# Patient Record
Sex: Female | Born: 1975 | Race: White | Hispanic: No | Smoking: Never smoker
Health system: Southern US, Community
[De-identification: ages and names within clinical notes are randomized; demographics above are authoritative.]

## PROBLEM LIST (undated history)

## (undated) DIAGNOSIS — N939 Abnormal uterine and vaginal bleeding, unspecified: Secondary | ICD-10-CM

## (undated) DIAGNOSIS — F419 Anxiety disorder, unspecified: Secondary | ICD-10-CM

## (undated) DIAGNOSIS — R600 Localized edema: Secondary | ICD-10-CM

## (undated) DIAGNOSIS — K219 Gastro-esophageal reflux disease without esophagitis: Secondary | ICD-10-CM

## (undated) DIAGNOSIS — E282 Polycystic ovarian syndrome: Secondary | ICD-10-CM

## (undated) HISTORY — PX: OTHER SURGICAL HISTORY: SHX169

## (undated) HISTORY — DX: Polycystic ovarian syndrome: E28.2

---

## 2014-08-03 HISTORY — PX: COMBINED HYSTEROSCOPY DIAGNOSTIC / D&C: SUR297

## 2015-02-22 LAB — HM PAP SMEAR: HM Pap smear: NEGATIVE

## 2015-04-04 HISTORY — PX: BILATERAL SALPINGECTOMY: SHX5743

## 2015-04-11 ENCOUNTER — Encounter
Admission: RE | Admit: 2015-04-11 | Discharge: 2015-04-11 | Disposition: A | Discharge status: Home / Self Care | Payer: Medicaid Other | Source: Ambulatory Visit | Attending: Obstetrics and Gynecology | Admitting: Obstetrics and Gynecology

## 2015-04-11 DIAGNOSIS — Z01812 Encounter for preprocedural laboratory examination: Secondary | ICD-10-CM | POA: Diagnosis present

## 2015-04-11 HISTORY — DX: Anxiety disorder, unspecified: F41.9

## 2015-04-11 HISTORY — DX: Abnormal uterine and vaginal bleeding, unspecified: N93.9

## 2015-04-11 HISTORY — DX: Gastro-esophageal reflux disease without esophagitis: K21.9

## 2015-04-11 HISTORY — DX: Localized edema: R60.0

## 2015-04-11 LAB — POTASSIUM: Potassium: 3.7 mmol/L (ref 3.5–5.1)

## 2015-04-11 NOTE — Patient Instructions (Signed)
  Your procedure is scheduled on: 9\15\16 Thurs  Report to Day Surgery. To find out your arrival time please call [phone] between 1PM - 3PM on 04/17/15 Wed.  Remember: Instructions that are not followed completely may result in serious medical risk, up to and including death, or upon the discretion of your surgeon and anesthesiologist your surgery may need to be rescheduled.    _x__ 1. Do not eat food or drink liquids after midnight. No gum chewing or hard candies.     ____ 2. No Alcohol for 24 hours before or after surgery.   ____ 3. Bring all medications with you on the day of surgery if instructed.    _x__ 4. Notify your doctor if there is any change in your medical condition     (cold, fever, infections).     Do not wear jewelry, make-up, hairpins, clips or nail polish.  Do not wear lotions, powders, or perfumes. You may wear deodorant.  Do not shave 48 hours prior to surgery. Men may shave face and neck.  Do not bring valuables to the hospital.    [facility] is not responsible for any belongings or valuables.               Contacts, dentures or bridgework may not be worn into surgery.  Leave your suitcase in the car. After surgery it may be brought to your room.  For patients admitted to the hospital, discharge time is determined by your                treatment team.   Patients discharged the day of surgery will not be allowed to drive home.   Please read over the following fact sheets that you were given:      _x___ Take these medicines the morning of surgery with A SIP OF WATER:    1. ALPRAZolam (XANAX) 1 MG tablet  2. Omeprazole (PRILOSEC PO)  3.   4.  5.  6.  ____ Fleet Enema (as directed)   ____ Use CHG Soap as directed  ____ Use inhalers on the day of surgery  ____ Stop metformin 2 days prior to surgery    ____ Take 1/2 of usual insulin dose the night before surgery and none on the morning of surgery.   ____ Stop Coumadin/Plavix/aspirin on   ____  Stop Anti-inflammatories on    ____ Stop supplements until after surgery.    ____ Bring C-Pap to the hospital.

## 2015-04-18 ENCOUNTER — Encounter
Admission: RE | Disposition: A | Discharge status: Home / Self Care | Payer: Self-pay | Source: Ambulatory Visit | Attending: Obstetrics and Gynecology

## 2015-04-18 ENCOUNTER — Ambulatory Visit: Payer: Medicaid Other | Admitting: Anesthesiology

## 2015-04-18 ENCOUNTER — Ambulatory Visit
Admission: RE | Admit: 2015-04-18 | Discharge: 2015-04-18 | Disposition: A | Discharge status: Home / Self Care | Payer: Medicaid Other | Source: Ambulatory Visit | Attending: Obstetrics and Gynecology | Admitting: Obstetrics and Gynecology

## 2015-04-18 ENCOUNTER — Encounter: Payer: Self-pay | Admitting: Anesthesiology

## 2015-04-18 DIAGNOSIS — Z302 Encounter for sterilization: Secondary | ICD-10-CM | POA: Diagnosis not present

## 2015-04-18 DIAGNOSIS — E119 Type 2 diabetes mellitus without complications: Secondary | ICD-10-CM | POA: Insufficient documentation

## 2015-04-18 DIAGNOSIS — N920 Excessive and frequent menstruation with regular cycle: Secondary | ICD-10-CM | POA: Insufficient documentation

## 2015-04-18 DIAGNOSIS — N803 Endometriosis of pelvic peritoneum: Secondary | ICD-10-CM | POA: Insufficient documentation

## 2015-04-18 DIAGNOSIS — F419 Anxiety disorder, unspecified: Secondary | ICD-10-CM | POA: Insufficient documentation

## 2015-04-18 DIAGNOSIS — K219 Gastro-esophageal reflux disease without esophagitis: Secondary | ICD-10-CM | POA: Insufficient documentation

## 2015-04-18 DIAGNOSIS — R600 Localized edema: Secondary | ICD-10-CM | POA: Insufficient documentation

## 2015-04-18 DIAGNOSIS — Z79899 Other long term (current) drug therapy: Secondary | ICD-10-CM | POA: Insufficient documentation

## 2015-04-18 DIAGNOSIS — Z9851 Tubal ligation status: Secondary | ICD-10-CM

## 2015-04-18 HISTORY — PX: ENDOMETRIAL ABLATION: SHX621

## 2015-04-18 HISTORY — PX: LAPAROSCOPIC TUBAL LIGATION: SHX1937

## 2015-04-18 LAB — POCT PREGNANCY, URINE: PREG TEST UR: NEGATIVE

## 2015-04-18 SURGERY — LIGATION, FALLOPIAN TUBE, LAPAROSCOPIC
Anesthesia: General

## 2015-04-18 MED ORDER — OXYCODONE HCL 5 MG/5ML PO SOLN: 5.0000 mg | Freq: Once | ORAL | Status: AC | PRN

## 2015-04-18 MED ORDER — FENTANYL CITRATE (PF) 100 MCG/2ML IJ SOLN
25.0000 ug | INTRAMUSCULAR | Status: AC | PRN
  Administered 2015-04-18 (×6): 25 ug via INTRAVENOUS

## 2015-04-18 MED ORDER — ROCURONIUM BROMIDE 100 MG/10ML IV SOLN
INTRAVENOUS | Status: DC | PRN
  Administered 2015-04-18: 10 mg via INTRAVENOUS
  Administered 2015-04-18: 30 mg via INTRAVENOUS
  Administered 2015-04-18: 10 mg via INTRAVENOUS

## 2015-04-18 MED ORDER — BUPIVACAINE HCL 0.5 % IJ SOLN
INTRAMUSCULAR | Status: DC | PRN
  Administered 2015-04-18: 20 mL

## 2015-04-18 MED ORDER — MIDAZOLAM HCL 2 MG/2ML IJ SOLN
INTRAMUSCULAR | Status: DC | PRN
  Administered 2015-04-18: 2 mg via INTRAVENOUS

## 2015-04-18 MED ORDER — FENTANYL CITRATE (PF) 100 MCG/2ML IJ SOLN
INTRAMUSCULAR | Status: DC | PRN
  Administered 2015-04-18: 100 ug via INTRAVENOUS
  Administered 2015-04-18 (×2): 50 ug via INTRAVENOUS

## 2015-04-18 MED ORDER — GLYCOPYRROLATE 0.2 MG/ML IJ SOLN
INTRAMUSCULAR | Status: DC | PRN
  Administered 2015-04-18: 0.6 mg via INTRAVENOUS

## 2015-04-18 MED ORDER — ONDANSETRON HCL 4 MG/2ML IJ SOLN
INTRAMUSCULAR | Status: DC | PRN
  Administered 2015-04-18: 4 mg via INTRAVENOUS

## 2015-04-18 MED ORDER — FENTANYL CITRATE (PF) 100 MCG/2ML IJ SOLN
INTRAMUSCULAR | Status: AC
  Filled 2015-04-18: qty 2

## 2015-04-18 MED ORDER — OXYCODONE HCL 5 MG PO TABS
ORAL_TABLET | ORAL | Status: AC
  Filled 2015-04-18: qty 1

## 2015-04-18 MED ORDER — NEOSTIGMINE METHYLSULFATE 10 MG/10ML IV SOLN
INTRAVENOUS | Status: DC | PRN
  Administered 2015-04-18: 5 mg via INTRAVENOUS

## 2015-04-18 MED ORDER — LIDOCAINE HCL (CARDIAC) 20 MG/ML IV SOLN
INTRAVENOUS | Status: DC | PRN
  Administered 2015-04-18: 100 mg via INTRAVENOUS

## 2015-04-18 MED ORDER — IBUPROFEN 600 MG PO TABS: 600.0000 mg | ORAL_TABLET | Freq: Four times a day (QID) | ORAL | Status: AC | PRN

## 2015-04-18 MED ORDER — DEXAMETHASONE SODIUM PHOSPHATE 4 MG/ML IJ SOLN
INTRAMUSCULAR | Status: DC | PRN
  Administered 2015-04-18: 10 mg via INTRAVENOUS

## 2015-04-18 MED ORDER — SUCCINYLCHOLINE CHLORIDE 20 MG/ML IJ SOLN
INTRAMUSCULAR | Status: DC | PRN
  Administered 2015-04-18: 100 mg via INTRAVENOUS

## 2015-04-18 MED ORDER — OXYCODONE-ACETAMINOPHEN 5-325 MG PO TABS: 1.0000 | ORAL_TABLET | Freq: Four times a day (QID) | ORAL | Status: DC | PRN

## 2015-04-18 MED ORDER — OXYCODONE HCL 5 MG PO TABS
5.0000 mg | ORAL_TABLET | Freq: Once | ORAL | Status: AC | PRN
  Administered 2015-04-18: 5 mg via ORAL

## 2015-04-18 MED ORDER — PROPOFOL 10 MG/ML IV BOLUS
INTRAVENOUS | Status: DC | PRN
  Administered 2015-04-18: 200 mg via INTRAVENOUS

## 2015-04-18 MED ORDER — LACTATED RINGERS IV SOLN
INTRAVENOUS | Status: DC
  Administered 2015-04-18: 07:00:00 via INTRAVENOUS
  Administered 2015-04-18: 50 mL/h via INTRAVENOUS

## 2015-04-18 SURGICAL SUPPLY — 41 items
APL SRG 38 LTWT LNG FL B (MISCELLANEOUS) ×2
APPLICATOR ARISTA FLEXITIP XL (MISCELLANEOUS) ×4 IMPLANT
APR BAND 32X8 2 INCS BAND (Ring) IMPLANT
BLADE SURG SZ11 CARB STEEL (BLADE) ×4 IMPLANT
CANISTER SUCT 3000ML (MISCELLANEOUS) ×4 IMPLANT
CATH ROBINSON RED A/P 16FR (CATHETERS) ×4 IMPLANT
CHLORAPREP W/TINT 26ML (MISCELLANEOUS) ×4 IMPLANT
DEFOGGER SCOPE WARMER CLEARIFY (MISCELLANEOUS) ×4 IMPLANT
DRSG TEGADERM 2-3/8X2-3/4 SM (GAUZE/BANDAGES/DRESSINGS) ×16 IMPLANT
GAUZE SPONGE NON-WVN 2X2 STRL (MISCELLANEOUS) ×4 IMPLANT
GLOVE BIO SURGEON STRL SZ7 (GLOVE) ×16 IMPLANT
GLOVE INDICATOR 7.5 STRL GRN (GLOVE) ×16 IMPLANT
GOWN STRL REUS W/ TWL LRG LVL3 (GOWN DISPOSABLE) ×4 IMPLANT
GOWN STRL REUS W/TWL LRG LVL3 (GOWN DISPOSABLE) ×8
HEMOSTAT ARISTA ABSORB 1G (Miscellaneous) ×4 IMPLANT
IRRIGATION STRYKERFLOW (MISCELLANEOUS) ×2 IMPLANT
IRRIGATOR STRYKERFLOW (MISCELLANEOUS) ×4
IV LACTATED RINGER IRRG 3000ML (IV SOLUTION) ×4
IV LACTATED RINGERS 1000ML (IV SOLUTION) ×8 IMPLANT
IV LR IRRIG 3000ML ARTHROMATIC (IV SOLUTION) ×2 IMPLANT
KIT DISPOSABLE FALLOPE RING (Ring) IMPLANT
KIT RM TURNOVER CYSTO AR (KITS) ×4 IMPLANT
LABEL OR SOLS (LABEL) ×4 IMPLANT
LIQUID BAND (GAUZE/BANDAGES/DRESSINGS) ×4 IMPLANT
NS IRRIG 500ML POUR BTL (IV SOLUTION) ×4 IMPLANT
PACK DNC HYST (MISCELLANEOUS) ×4 IMPLANT
PACK GYN LAPAROSCOPIC (MISCELLANEOUS) ×4 IMPLANT
PAD OB MATERNITY 4.3X12.25 (PERSONAL CARE ITEMS) ×4 IMPLANT
PAD PREP 24X41 OB/GYN DISP (PERSONAL CARE ITEMS) ×4 IMPLANT
SEAL ROD LENS SCOPE MYOSURE (ABLATOR) ×4 IMPLANT
SET CYSTO W/LG BORE CLAMP LF (SET/KITS/TRAYS/PACK) ×4 IMPLANT
SLEEVE ENDOPATH XCEL 5M (ENDOMECHANICALS) ×4 IMPLANT
SPONGE VERSALON 2X2 STRL (MISCELLANEOUS) ×6
SUT MNCRL AB 4-0 PS2 18 (SUTURE) ×4 IMPLANT
SUT VIC AB 0 CT1 36 (SUTURE) ×4 IMPLANT
TROCAR ENDO BLADELESS 11MM (ENDOMECHANICALS) ×8 IMPLANT
TROCAR XCEL NON-BLD 5MMX100MML (ENDOMECHANICALS) ×4 IMPLANT
TUBING CONNECTING 10 (TUBING) ×3 IMPLANT
TUBING CONNECTING 10' (TUBING) ×1
TUBING HYSTEROSCOPY DOLPHIN (MISCELLANEOUS) IMPLANT
TUBING INSUFFLATOR HI FLOW (MISCELLANEOUS) ×4 IMPLANT

## 2015-04-18 NOTE — Op Note (Signed)
Patient Name: Wanda [name] Date of Procedure: 04/18/2015  Preoperative Diagnosis: 1) 39 y.o. with menorrhagia 2) Desires permanent surgical sterilization  Postoperative Diagnosis: 1) 39 y.o. with menorrhagia 2) Desires permanent surgical sterilization 3) Endometriosis  Operation Performed: Hysteroscopy, dilation and curettage, Novasure endometrial ablation, laparoscopic bilateral salpingectomy  Indication: 39 y.o. No obstetric history on file.  with undesired fertility, desires permanent sterilization.  Other reversible forms of contraception were discussed with patient; she declines all other modalities. Permanent nature of as well as associated risks of the procedure discussed with patient including but not limited to: risk of regret, permanence of method, bleeding, infection, injury to surrounding organs and need for additional procedures.  Failure risk of 0.5-1% with increased risk of ectopic gestation if pregnancy occurs was also discussed with patient.  Patient counseled regarding alternative to novasure endometrial ablation, discussed 25% 4 year failure raite.  Anesthesia:General  Primary Surgeon: Vena Austria, MD  Assistant: Thomasene Mohair MD  Preoperative Antibiotics: none  Estimated Blood Loss: 25mL  IV Fluids:  Urine Output:: ~49mL straiggt cath  Drains or Tubes: none  Implants: none  Specimens Removed: endometrial curettings, bilateral tubal segments  Complications: none  Intraoperative Findings:  Normal cervix, normal endometrial cavity with bilateral ostia visualized.  Postablation hysteroscopy revealed good coverage of the endometrial cavity with sparing of the endocervix.  Laparoscopy revealed endometriosis involving the left aspect of the uterine fundus to the anterior abdominal wall and sigmoid colon.  Simple cyst noted on right ovary  Patient Condition: stable  Procedure in Detail:  Patient was taken to the operating room were she was  administered general endotracheal anesthesia.  She was positioned in the dorsal lithotomy position utilizing Allen stirups, prepped and draped in the usual sterile fashion.  Uterus was noted to be non-enlarged in size, anteverted.   Prior to proceeding with the case a time out was performed.  Attention was turned to the patient's pelvis.  A red rubber catheter was used to empty the patient's bladder.  An operative speculum was placed to allow visualization of the cervix.  The anterior lip of the cervix was grasped with a single tooth tenaculum, uterus was sounded to 9.5cm, and the cervix was sequentially dilated using pratt dilators.  The hysteroscope was then advanced into the uterine cavity noting the above findings.  Sharp curettage was performed and the resulting specimen collected and sent to pathology.  The cervix was measured using the hysteroscopy at 5.5cm.   The Novasure device was introduced into the uterine cavity and deployed yielding cavity width of 4.6cm.  The device passed cavity assessment and was activated with burn time of 120 seconds.  Postablation hysteroscopy revealed the above findings.  A Hulka tenaculum was placed to allow manipulation of the uterus.  The operative speculum and single tooth tenaculum were then removed.  Attention was turned to the patient's abdomen.  The umbilicus was infiltrated with 1% Sensorcaine, before making a stab incision using an 11 blade scalpel.  A 5mm Excel trocar was then used to gain direct entry into the peritoneal cavity utilizing the camera to visualize progress of the trocar during placement.  Once peritoneal entry had been achieved, insufflation was started and pneumoperitoneum established at a pressure of .   General inspection of the abdomen revealed the above noted findings.  A spot 2cm midline above the pubic symphysis was injected with 1% Sensorcaine and a stab incision was made using an 11 blade scalpel.  The 8mm falope ring trocar was place  suprapubic through this incision under direct visualization.  An 11mm left upper quadrant port was introduced under direct visualization at Plamer's point.  Given the degree of adhesion of the left side decision was made to switch to salpingectomy from planned falope ring application out of concern of being able to obtain a good 1cm knuckle of tube.  The left tube was identified, freed from it adhesion using blunt dissection,  and excised using a 5mm harmonic.  The left tube was identified and a second falope ring was applied in a similar fashion.  There was some oozing from the left pedicle seondary to the endometriotic implants, this was controlled using the harmonic and 1g of arista powder was applied.  The 11mm port site was closed using a ) Vicryl fascial stitch and Carter-Thompson.  Pneumoperitoneum was evacuated. Skin was closed with 4-0 Monocryl in a subcuticular fashion on both the 11mm and 8mm trochar site.    The trocars were removed.  The 8mm trocar site was closed with 4-0 Monocryl in a subcuticular fashion.  All trocar sites were then dressed with surgical skin glue.  The Hulka tenaculum was removed.  Sponge needle and instrument counts were correct time two.  The patient tolerated the procedure well and was taken to the recovery room in stable condition.

## 2015-04-18 NOTE — Discharge Instructions (Signed)
Laparoscopic Tubal Ligation °Care After °Refer to this sheet in the next few weeks. These instructions provide you with information on caring for yourself after your procedure. Your caregiver may also give you more specific instructions. Your treatment has been planned according to current medical practices, but problems sometimes occur. Call your caregiver if you have any problems or questions after your procedure. °HOME CARE INSTRUCTIONS  °· Rest the remainder of the day. °· Only take over-the-counter or prescription medicines for pain, discomfort, or fever as directed by your caregiver. Do not take aspirin. It can cause bleeding. °· Gradually resume daily activities, diet, rest, driving, and work. °· Avoid sexual intercourse for 2 weeks or as directed. °· Do not use tampons or douche. °· Do not drive while taking pain medicine. °· Do not lift anything over 5 pounds for 2 weeks or as directed. °· Only take showers, not baths, until you are seen by your caregiver. °· Change bandages (dressings) as directed. °· Take your temperature twice a day and record it. °· Try to have help for the first 7 to 10 days for your household needs. °· Return to your caregiver to get your stitches (sutures) removed and for follow-up visits as directed. °SEEK MEDICAL CARE IF:  °· You have redness, swelling, or increasing pain in a wound. °· You have drainage from a wound lasting longer than 1 day. °· Your pain is getting worse. °· You have a rash. °· You become dizzy or lightheaded. °· You have a reaction to your medicine. °· You need stronger medicine or a change in your pain medicine. °· You notice a bad smell coming from a wound or dressing. °· Your wound breaks open after the sutures have been removed. °· You are constipated. °SEEK IMMEDIATE MEDICAL CARE IF:  °· You faint. °· You have a fever. °· You have increasing abdominal pain. °· You have severe pain in your shoulders. °· You have bleeding or drainage from the suture sites or  vagina following surgery. °· You have shortness of breath or difficulty breathing. °· You have chest or leg pain. °· You have persistent nausea, vomiting, or diarrhea. °MAKE SURE YOU:  °· Understand these instructions. °· Watch your condition. °· Get help right away if you are not doing well or get worse. °Document Released: 02/06/2005 Document Revised: 01/19/2012 Document Reviewed: 10/31/2011 °ExitCare® Patient Information ©2015 [employer]. This information is not intended to replace advice given to you by your health care provider. Make sure you discuss any questions you have with your health care provider. ° °

## 2015-04-18 NOTE — Anesthesia Preprocedure Evaluation (Signed)
Anesthesia Evaluation  Patient identified by MRN, date of birth, ID band Patient awake    Reviewed: Allergy & Precautions, H&P , NPO status , Patient's Chart, lab work & pertinent test results  History of Anesthesia Complications Negative for: history of anesthetic complications  Airway Mallampati: III  TM Distance: <3 FB Neck ROM: full    Dental no notable dental hx. (+) Teeth Intact   Pulmonary neg pulmonary ROS,    Pulmonary exam normal breath sounds clear to auscultation       Cardiovascular Exercise Tolerance: Good (-) Past MI Normal cardiovascular exam Rhythm:regular Rate:Normal     Neuro/Psych PSYCHIATRIC DISORDERS Anxiety negative neurological ROS  negative psych ROS   GI/Hepatic Neg liver ROS, Controlled,  Endo/Other  diabetes, Well Controlled, Type 2  Renal/GU negative Renal ROS  negative genitourinary   Musculoskeletal   Abdominal   Peds  Hematology negative hematology ROS (+)   Anesthesia Other Findings Past Medical History:   Anxiety                                                      Abnormal uterine bleeding                                    GERD (gastroesophageal reflux disease)                       Lower extremity edema                                        Reproductive/Obstetrics negative OB ROS                             Anesthesia Physical Anesthesia Plan  ASA: III  Anesthesia Plan: General ETT   Post-op Pain Management:    Induction:   Airway Management Planned:   Additional Equipment:   Intra-op Plan:   Post-operative Plan:   Informed Consent: I have reviewed the patients History and Physical, chart, labs and discussed the procedure including the risks, benefits and alternatives for the proposed anesthesia with the patient or authorized representative who has indicated his/her understanding and acceptance.   Dental Advisory Given  Plan  Discussed with: Anesthesiologist, CRNA and Surgeon  Anesthesia Plan Comments:         Anesthesia Quick Evaluation

## 2015-04-18 NOTE — Transfer of Care (Signed)
Immediate Anesthesia Transfer of Care Note  Patient: Wanda [name]  Procedure(s) Performed: Procedure(s): LAPAROSCOPIC TUBAL LIGATION (Bilateral) ENDOMETRIAL ABLATION (N/A)  Patient Location: PACU  Anesthesia Type:General  Level of Consciousness: awake  Airway & Oxygen Therapy: Patient Spontanous Breathing  Post-op Assessment: Report given to RN  Post vital signs: stable  Last Vitals:  Filed Vitals:   04/18/15 0945  BP: 129/72  Pulse: 92  Temp: 37 C  Resp: 10    Complications: No apparent anesthesia complications

## 2015-04-18 NOTE — H&P (Addendum)
Date of Initial paper H&P  History reviewed, patient examined, no change in status, stable for surgery.

## 2015-04-18 NOTE — Anesthesia Procedure Notes (Signed)
Procedure Name: Intubation Date/Time: 04/18/2015 7:39 AM Performed by: Pearla Dubonnet Pre-anesthesia Checklist: Patient identified, Emergency Drugs available, Suction available, Patient being monitored and Timeout performed Patient Re-evaluated:Patient Re-evaluated prior to inductionOxygen Delivery Method: Circle system utilized Preoxygenation: Pre-oxygenation with 100% oxygen Intubation Type: IV induction Ventilation: Mask ventilation without difficulty Laryngoscope Size: Mac, 3 and McGraph Grade View: Grade I Tube type: Oral Number of attempts: 1 Airway Equipment and Method: Stylet and Video-laryngoscopy Placement Confirmation: ETT inserted through vocal cords under direct vision,  positive ETCO2 and breath sounds checked- equal and bilateral Secured at: 21 cm Tube secured with: Tape Dental Injury: Teeth and Oropharynx as per pre-operative assessment  Difficulty Due To: Difficulty was anticipated and Difficult Airway- due to anterior larynx Future Recommendations: Recommend- induction with short-acting agent, and alternative techniques readily available

## 2015-04-18 NOTE — Anesthesia Postprocedure Evaluation (Signed)
  Anesthesia Post-op Note  Patient: Wanda [name]  Procedure(s) Performed: Procedure(s): LAPAROSCOPIC TUBAL LIGATION (Bilateral) ENDOMETRIAL ABLATION (N/A)  Anesthesia type:General ETT  Patient location: PACU  Post pain: Pain level controlled  Post assessment: Post-op Vital signs reviewed, Patient's Cardiovascular Status Stable, Respiratory Function Stable, Patent Airway and No signs of Nausea or vomiting  Post vital signs: Reviewed and stable  Last Vitals:  Filed Vitals:   04/18/15 1040  BP:   Pulse: 81  Temp:   Resp: 17    Level of consciousness: awake, alert  and patient cooperative  Complications: No apparent anesthesia complications

## 2015-04-19 LAB — SURGICAL PATHOLOGY

## 2016-01-16 ENCOUNTER — Other Ambulatory Visit: Payer: Self-pay | Admitting: Sports Medicine

## 2016-01-16 DIAGNOSIS — R609 Edema, unspecified: Secondary | ICD-10-CM

## 2016-01-16 DIAGNOSIS — R531 Weakness: Secondary | ICD-10-CM

## 2016-01-16 DIAGNOSIS — M25562 Pain in left knee: Secondary | ICD-10-CM

## 2016-01-24 ENCOUNTER — Ambulatory Visit
Admission: RE | Admit: 2016-01-24 | Discharge: 2016-01-24 | Disposition: A | Discharge status: Home / Self Care | Payer: Medicaid Other | Source: Ambulatory Visit | Attending: Sports Medicine | Admitting: Sports Medicine

## 2016-01-24 DIAGNOSIS — R609 Edema, unspecified: Secondary | ICD-10-CM

## 2016-01-24 DIAGNOSIS — R531 Weakness: Secondary | ICD-10-CM

## 2016-01-24 DIAGNOSIS — M25562 Pain in left knee: Secondary | ICD-10-CM

## 2017-03-05 ENCOUNTER — Encounter: Payer: Self-pay | Admitting: Obstetrics and Gynecology

## 2017-03-08 ENCOUNTER — Encounter: Payer: Self-pay | Admitting: Obstetrics and Gynecology

## 2017-03-08 ENCOUNTER — Other Ambulatory Visit: Payer: Self-pay | Admitting: Obstetrics and Gynecology

## 2017-03-08 ENCOUNTER — Ambulatory Visit (INDEPENDENT_AMBULATORY_CARE_PROVIDER_SITE_OTHER): Payer: BLUE CROSS/BLUE SHIELD | Admitting: Obstetrics and Gynecology

## 2017-03-08 VITALS — BP 104/72 | HR 82 | Ht 65.0 in | Wt 224.0 lb

## 2017-03-08 DIAGNOSIS — Z131 Encounter for screening for diabetes mellitus: Secondary | ICD-10-CM

## 2017-03-08 DIAGNOSIS — Z6837 Body mass index (BMI) 37.0-37.9, adult: Secondary | ICD-10-CM

## 2017-03-08 DIAGNOSIS — Z01419 Encounter for gynecological examination (general) (routine) without abnormal findings: Secondary | ICD-10-CM

## 2017-03-08 DIAGNOSIS — E669 Obesity, unspecified: Secondary | ICD-10-CM

## 2017-03-08 DIAGNOSIS — Z1329 Encounter for screening for other suspected endocrine disorder: Secondary | ICD-10-CM

## 2017-03-08 DIAGNOSIS — Z13220 Encounter for screening for lipoid disorders: Secondary | ICD-10-CM | POA: Diagnosis not present

## 2017-03-08 DIAGNOSIS — Z1239 Encounter for other screening for malignant neoplasm of breast: Secondary | ICD-10-CM

## 2017-03-08 DIAGNOSIS — Z1231 Encounter for screening mammogram for malignant neoplasm of breast: Secondary | ICD-10-CM | POA: Diagnosis not present

## 2017-03-08 NOTE — Progress Notes (Signed)
Patient ID: Wanda [name], female   DOB: Apr 20, 1976, 41 y.o.   MRN: [id number]     Gynecology Annual Exam  PCP: System, Pcp Not In  Chief Complaint:  Chief Complaint  Patient presents with  . Gynecologic Exam    History of Present Illness: Patient is a 41 y.o. G3P3003 presents for annual exam. The patient has no complaints today.   LMP: No LMP recorded. Patient has had an ablation. Rare spotting once in a while but otherwise achieved amenorrhea following ablation in 2016  The patient is sexually active. She currently uses tubal ligation for contraception. She denies dyspareunia.  The patient does not perform self breast exams.  There is no notable family history of breast or ovarian cancer in her family.  The patient wears seatbelts: yes.   The patient has regular exercise: not asked.    The patient denies current symptoms of depression.    She has noted some continued pain at the site of her C-section incision.  Review of Systems: Review of Systems  Constitutional: Negative for chills and fever.  HENT: Negative for congestion.   Respiratory: Negative for cough and shortness of breath.   Cardiovascular: Negative for chest pain and palpitations.  Gastrointestinal: Negative for abdominal pain, constipation, diarrhea, heartburn, nausea and vomiting.  Genitourinary: Negative for dysuria, frequency and urgency.  Skin: Negative for itching and rash.  Neurological: Negative for dizziness and headaches.  Endo/Heme/Allergies: Negative for polydipsia.  Psychiatric/Behavioral: Negative for depression.    Past Medical History:  Past Medical History:  Diagnosis Date  . Abnormal uterine bleeding   . Anxiety   . GERD (gastroesophageal reflux disease)   . Lower extremity edema   . PCOS (polycystic ovarian syndrome)     Past Surgical History:  Past Surgical History:  Procedure Laterality Date  . BILATERAL SALPINGECTOMY  04/2015   [facility]  . c sections    . COMBINED HYSTEROSCOPY  DIAGNOSTIC / D&C  2016   [facility]  . ENDOMETRIAL ABLATION N/A 04/18/2015   Procedure: ENDOMETRIAL ABLATION;  Surgeon: Vena Austria, MD;  Location: [facility] ORS;  Service: Gynecology;  Laterality: N/A;  . LAPAROSCOPIC TUBAL LIGATION Bilateral 04/18/2015   Procedure: LAPAROSCOPIC TUBAL LIGATION;  Surgeon: Vena Austria, MD;  Location: [facility] ORS;  Service: Gynecology;  Laterality: Bilateral;    Gynecologic History:  No LMP recorded. Patient has had an ablation. Contraception: tubal ligation Last Pap: Results were: 02/21/17 NIL and HR HPV negative  Last mammogram: none on record  Obstetric History: G3P3003  Family History:  Family History  Problem Relation Age of Onset  . Leukemia Maternal Grandfather     Social History:  Social History   Social History  . Marital status: Divorced    Spouse name: N/A  . Number of children: N/A  . Years of education: N/A   Occupational History  . Not on file.   Social History Main Topics  . Smoking status: Never Smoker  . Smokeless tobacco: Never Used  . Alcohol use No  . Drug use: No  . Sexual activity: Yes    Partners: Male    Birth control/ protection: None   Other Topics Concern  . Not on file   Social History Narrative  . No narrative on file    Allergies:  No Known Allergies  Medications: Prior to Admission medications   Medication Sig Start Date End Date Taking? Authorizing Provider  furosemide (LASIX) 10 MG/ML solution Take 10 mg by mouth 2 (two) times daily.   Yes  [provider]  ibuprofen (ADVIL,MOTRIN) 600 MG tablet Take 1 tablet (600 mg total) by mouth every 6 (six) hours as needed. 04/18/15  Yes Vena AustriaStaebler, Jamine Highfill, MD    Physical Exam Vitals: Blood pressure 104/72, pulse 82, height 5\' 5"  (1.651 m), weight 224 lb (101.6 kg). Body mass index is 37.28 kg/m.  General: NAD HEENT: normocephalic, anicteric Thyroid: no enlargement, no palpable nodules Pulmonary: No increased work of breathing,  CTAB Cardiovascular: RRR, distal pulses 2+ Breast: Breast symmetrical, no tenderness, no palpable nodules or masses, no skin or nipple retraction present, no nipple discharge.  No axillary or supraclavicular lymphadenopathy. Abdomen: NABS, soft, non-tender, non-distended.  Umbilicus without lesions.  No hepatomegaly, splenomegaly or masses palpable. No evidence of hernia  Genitourinary:  External: Normal external female genitalia.  Normal urethral meatus, normal Bartholin's and Skene's glands.    Vagina: Normal vaginal mucosa, no evidence of prolapse.    Cervix: Grossly normal in appearance, no bleeding  Uterus: Non-enlarged, mobile, normal contour.  No CMT  Adnexa: ovaries non-enlarged, no adnexal masses  Rectal: deferred  Lymphatic: no evidence of inguinal lymphadenopathy Extremities: no edema, erythema, or tenderness Neurologic: Grossly intact Psychiatric: mood appropriate, affect full  Female chaperone present for pelvic and breast  portions of the physical exam    Assessment: 41 y.o. [id number] annual exam  Plan: Problem List Items Addressed This Visit    None    Visit Diagnoses    Screening cholesterol level    -  Primary   Relevant Orders   CMP14+LP+TP+TSH+CBC/Plt   Breast screening       Relevant Orders   MM DIGITAL SCREENING BILATERAL   Encounter for gynecological examination without abnormal finding       Relevant Orders   CMP14+LP+TP+TSH+CBC/Plt   Thyroid disorder screening       Relevant Orders   CMP14+LP+TP+TSH+CBC/Plt   Diabetes mellitus screening       Relevant Orders   CMP14+LP+TP+TSH+CBC/Plt   Hemoglobin A1c   Class 2 obesity without serious comorbidity with body mass index (BMI) of 37.0 to 37.9 in adult, unspecified obesity type       Relevant Orders   CMP14+LP+TP+TSH+CBC/Plt   Hemoglobin A1c      1) Mammogram - recommend yearly screening mammogram.  Mammogram Was ordered today   2) STI screening was offered and accepted  3) ASCCP guidelines and  rational discussed.  Patient opts for yearly screening interval  4) Contraception - s/p BTL  5) Colonoscopy -- Screening recommended starting at age 43 for average risk individuals, age 43 for individuals deemed at increased risk (including African Americans) and recommended to continue until age 43.  For patient age 43-85 individualized approach is recommended.  Gold standard screening is via colonoscopy, Cologuard screening is an acceptable alternative for patient unwilling or unable to undergo colonoscopy.  "Colorectal cancer screening for average?risk adults: 2018 guideline update from the American Cancer Society"[place]: A Cancer Journal for Clinicians: Dec 30, 2016   6) Routine healthcare maintenance including cholesterol, diabetes screening discussed Ordered today

## 2017-03-08 NOTE — Patient Instructions (Signed)
Preventive Care 40-64 Years, Female Preventive care refers to lifestyle choices and visits with your health care provider that can promote health and wellness. What does preventive care include?  A yearly physical exam. This is also called an annual well check.  Dental exams once or twice a year.  Routine eye exams. Ask your health care provider how often you should have your eyes checked.  Personal lifestyle choices, including: ? Daily care of your teeth and gums. ? Regular physical activity. ? Eating a healthy diet. ? Avoiding tobacco and drug use. ? Limiting alcohol use. ? Practicing safe sex. ? Taking low-dose aspirin daily starting at age 58. ? Taking vitamin and mineral supplements as recommended by your health care provider. What happens during an annual well check? The services and screenings done by your health care provider during your annual well check will depend on your age, overall health, lifestyle risk factors, and family history of disease. Counseling Your health care provider may ask you questions about your:  Alcohol use.  Tobacco use.  Drug use.  Emotional well-being.  Home and relationship well-being.  Sexual activity.  Eating habits.  Work and work Statistician.  Method of birth control.  Menstrual cycle.  Pregnancy history.  Screening You may have the following tests or measurements:  Height, weight, and BMI.  Blood pressure.  Lipid and cholesterol levels. These may be checked every 5 years, or more frequently if you are over 81 years old.  Skin check.  Lung cancer screening. You may have this screening every year starting at age 78 if you have a 30-pack-year history of smoking and currently smoke or have quit within the past 15 years.  Fecal occult blood test (FOBT) of the stool. You may have this test every year starting at age 65.  Flexible sigmoidoscopy or colonoscopy. You may have a sigmoidoscopy every 5 years or a colonoscopy  every 10 years starting at age 30.  Hepatitis C blood test.  Hepatitis B blood test.  Sexually transmitted disease (STD) testing.  Diabetes screening. This is done by checking your blood sugar (glucose) after you have not eaten for a while (fasting). You may have this done every 1-3 years.  Mammogram. This may be done every 1-2 years. Talk to your health care provider about when you should start having regular mammograms. This may depend on whether you have a family history of breast cancer.  BRCA-related cancer screening. This may be done if you have a family history of breast, ovarian, tubal, or peritoneal cancers.  Pelvic exam and Pap test. This may be done every 3 years starting at age 80. Starting at age 36, this may be done every 5 years if you have a Pap test in combination with an HPV test.  Bone density scan. This is done to screen for osteoporosis. You may have this scan if you are at high risk for osteoporosis.  Discuss your test results, treatment options, and if necessary, the need for more tests with your health care provider. Vaccines Your health care provider may recommend certain vaccines, such as:  Influenza vaccine. This is recommended every year.  Tetanus, diphtheria, and acellular pertussis (Tdap, Td) vaccine. You may need a Td booster every 10 years.  Varicella vaccine. You may need this if you have not been vaccinated.  Zoster vaccine. You may need this after age 5.  Measles, mumps, and rubella (MMR) vaccine. You may need at least one dose of MMR if you were born in  1959 or later. You may also need a second dose.  Pneumococcal 13-valent conjugate (PCV13) vaccine. You may need this if you have certain conditions and were not previously vaccinated.  Pneumococcal polysaccharide (PPSV23) vaccine. You may need one or two doses if you smoke cigarettes or if you have certain conditions.  Meningococcal vaccine. You may need this if you have certain  conditions.  Hepatitis A vaccine. You may need this if you have certain conditions or if you travel or work in places where you may be exposed to hepatitis A.  Hepatitis B vaccine. You may need this if you have certain conditions or if you travel or work in places where you may be exposed to hepatitis B.  Haemophilus influenzae type b (Hib) vaccine. You may need this if you have certain conditions.  Talk to your health care provider about which screenings and vaccines you need and how often you need them. This information is not intended to replace advice given to you by your health care provider. Make sure you discuss any questions you have with your health care provider. Document Released: 08/16/2015 Document Revised: 04/08/2016 Document Reviewed: 05/21/2015 Elsevier Interactive Patient Education  2017 [employer].

## 2017-03-10 LAB — CMP14+LP+TP+TSH+CBC/PLT
ALBUMIN: 4 g/dL (ref 3.5–5.5)
ALK PHOS: 82 IU/L (ref 39–117)
ALT: 14 IU/L (ref 0–32)
AST: 12 IU/L (ref 0–40)
Albumin/Globulin Ratio: 1.3 (ref 1.2–2.2)
BUN/Creatinine Ratio: 13 (ref 9–23)
BUN: 7 mg/dL (ref 6–24)
Bilirubin Total: 0.3 mg/dL (ref 0.0–1.2)
CALCIUM: 9.2 mg/dL (ref 8.7–10.2)
CO2: 22 mmol/L (ref 20–29)
Chloride: 104 mmol/L (ref 96–106)
Cholesterol, Total: 205 mg/dL — ABNORMAL HIGH (ref 100–199)
Creatinine, Ser: 0.56 mg/dL — ABNORMAL LOW (ref 0.57–1.00)
Free Thyroxine Index: 2 (ref 1.2–4.9)
GFR calc Af Amer: 132 mL/min/{1.73_m2} (ref >59)
GFR, EST NON AFRICAN AMERICAN: 115 mL/min/{1.73_m2} (ref >59)
GLOBULIN, TOTAL: 3 g/dL (ref 1.5–4.5)
Glucose: 94 mg/dL (ref 65–99)
HDL: 53 mg/dL (ref >39)
Hematocrit: 40 % (ref 34.0–46.6)
Hemoglobin: 12.8 g/dL (ref 11.1–15.9)
LDL CALC: 120 mg/dL — AB (ref 0–99)
LDL/HDL RATIO: 2.3 ratio (ref 0.0–3.2)
MCH: 28.6 pg (ref 26.6–33.0)
MCHC: 32 g/dL (ref 31.5–35.7)
MCV: 90 fL (ref 79–97)
PLATELETS: 309 10*3/uL (ref 150–379)
Potassium: 4 mmol/L (ref 3.5–5.2)
RBC: 4.47 x10E6/uL (ref 3.77–5.28)
RDW: 14.1 % (ref 12.3–15.4)
Sodium: 141 mmol/L (ref 134–144)
T3 UPTAKE RATIO: 23 % — AB (ref 24–39)
T4, Total: 8.9 ug/dL (ref 4.5–12.0)
TRIGLYCERIDES: 161 mg/dL — AB (ref 0–149)
TSH: 1.07 u[IU]/mL (ref 0.450–4.500)
Total Protein: 7 g/dL (ref 6.0–8.5)
VLDL CHOLESTEROL CAL: 32 mg/dL (ref 5–40)
WBC: 9.4 10*3/uL (ref 3.4–10.8)

## 2017-03-10 LAB — HEMOGLOBIN A1C
Est. average glucose Bld gHb Est-mCnc: 114 mg/dL
Hgb A1c MFr Bld: 5.6 % (ref 4.8–5.6)

## 2017-03-11 ENCOUNTER — Telehealth: Payer: Self-pay | Admitting: Obstetrics and Gynecology

## 2017-03-11 NOTE — Telephone Encounter (Signed)
Pt is calling back due to missed call. Please advise °

## 2017-03-11 NOTE — Telephone Encounter (Signed)
I did not call patient. Message forwarded to AMS 

## 2017-03-23 ENCOUNTER — Ambulatory Visit
Admission: RE | Admit: 2017-03-23 | Discharge: 2017-03-23 | Disposition: A | Discharge status: Home / Self Care | Payer: BLUE CROSS/BLUE SHIELD | Source: Ambulatory Visit | Attending: Obstetrics and Gynecology | Admitting: Obstetrics and Gynecology

## 2017-03-23 ENCOUNTER — Encounter (INDEPENDENT_AMBULATORY_CARE_PROVIDER_SITE_OTHER): Payer: Self-pay

## 2017-03-23 DIAGNOSIS — Z1239 Encounter for other screening for malignant neoplasm of breast: Secondary | ICD-10-CM

## 2017-03-23 DIAGNOSIS — Z1231 Encounter for screening mammogram for malignant neoplasm of breast: Secondary | ICD-10-CM | POA: Diagnosis not present

## 2017-03-26 ENCOUNTER — Encounter: Payer: Self-pay | Admitting: Obstetrics and Gynecology

## 2017-06-30 ENCOUNTER — Telehealth: Payer: Self-pay | Admitting: Obstetrics and Gynecology

## 2017-06-30 NOTE — Telephone Encounter (Signed)
Pt is calling about her incision site from 16 years ago if currently coming open. Pt was advise to call and see if Dr. Bonney AidStaebler would see her for this. Please advise.

## 2017-06-30 NOTE — Telephone Encounter (Signed)
Pt probably needs to see surgeon who did surgery

## 2017-06-30 NOTE — Telephone Encounter (Signed)
I can take look at it if she would like

## 2017-07-05 ENCOUNTER — Ambulatory Visit (INDEPENDENT_AMBULATORY_CARE_PROVIDER_SITE_OTHER): Payer: BLUE CROSS/BLUE SHIELD | Admitting: Obstetrics and Gynecology

## 2017-07-05 ENCOUNTER — Encounter: Payer: Self-pay | Admitting: Obstetrics and Gynecology

## 2017-07-05 ENCOUNTER — Other Ambulatory Visit: Payer: Self-pay

## 2017-07-05 VITALS — BP 104/66 | HR 82 | Ht 65.0 in | Wt 216.0 lb

## 2017-07-05 DIAGNOSIS — T8183XS Persistent postprocedural fistula, sequela: Secondary | ICD-10-CM

## 2017-07-05 MED ORDER — NYSTATIN 100000 UNIT/GM EX CREA: 1.0000 "application " | TOPICAL_CREAM | Freq: Two times a day (BID) | CUTANEOUS | 1 refills | Status: AC

## 2017-07-05 NOTE — Progress Notes (Signed)
Obstetrics & Gynecology Office Visit   Chief Complaint:  Chief Complaint  Patient presents with  . Wound Check    Still w/knot & itches/bleeds    History of Present Illness: 41 year old with a history of 2 prior cesarean section, both via midline vertical incision, the last being 16 years ago.  She has had intermittent irritation and discomfort at the inferior portion for several years.  The area gets irritated and discharges.  Discharge described as blood.  Then the area crusts over and she usually has a symptom free interval prior to another flare up.  Last time she thought she saw something inside the opening but she didn't attempt to remove it.  She has not noted any fevers or chills.  Other than local pain no generalized abdominal pain, no nausea or vomiting.   Review of Systems: Review of Systems  Constitutional: Negative for chills, fever, malaise/fatigue and weight loss.  Gastrointestinal: Positive for abdominal pain. Negative for nausea and vomiting.  Skin: Positive for rash. Negative for itching.    Past Medical History:  Past Medical History:  Diagnosis Date  . Abnormal uterine bleeding   . Anxiety   . GERD (gastroesophageal reflux disease)   . Lower extremity edema   . PCOS (polycystic ovarian syndrome)     Past Surgical History:  Past Surgical History:  Procedure Laterality Date  . BILATERAL SALPINGECTOMY  04/2015   [facility]  . c sections    . COMBINED HYSTEROSCOPY DIAGNOSTIC / D&C  2016   [facility]  . ENDOMETRIAL ABLATION N/A 04/18/2015   Procedure: ENDOMETRIAL ABLATION;  Surgeon: Vena AustriaAndreas Kaleiyah Polsky, MD;  Location: [facility]S;  Service: Gynecology;  Laterality: N/A;  . LAPAROSCOPIC TUBAL LIGATION Bilateral 04/18/2015   Procedure: LAPAROSCOPIC TUBAL LIGATION;  Surgeon: Vena AustriaAndreas Berkley Wrightsman, MD;  Location: [facility] ORS;  Service: Gynecology;  Laterality: Bilateral;    Gynecologic History: No LMP recorded. Patient has had an ablation.  Obstetric History:  [id number]  Family History:  Family History  Problem Relation Age of Onset  . Leukemia Maternal Grandfather     Social History:  Social History   Socioeconomic History  . Marital status: Divorced    Spouse name: Not on file  . Number of children: Not on file  . Years of education: Not on file  . Highest education level: Not on file  Social Needs  . Financial resource strain: Not on file  . Food insecurity - worry: Not on file  . Food insecurity - inability: Not on file  . Transportation needs - medical: Not on file  . Transportation needs - non-medical: Not on file  Occupational History  . Not on file  Tobacco Use  . Smoking status: Never Smoker  . Smokeless tobacco: Never Used  Substance and Sexual Activity  . Alcohol use: No  . Drug use: No  . Sexual activity: Yes    Partners: Male    Birth control/protection: None  Other Topics Concern  . Not on file  Social History Narrative  . Not on file    Allergies:  No Known Allergies  Medications: Prior to Admission medications   Medication Sig Start Date End Date Taking? Authorizing Provider  ALPRAZolam Prudy Feeler(XANAX) 0.5 MG tablet Take 0.5 mg by mouth at bedtime as needed for anxiety.   Yes [provider]  furosemide (LASIX) 10 MG/ML solution Take 10 mg by mouth 2 (two) times daily.    [provider]  ibuprofen (ADVIL,MOTRIN) 600 MG tablet Take  1 tablet (600 mg total) by mouth every 6 (six) hours as needed. 04/18/15   Vena AustriaStaebler, Maziyah Vessel, MD  nystatin cream (MYCOSTATIN) Apply 1 application topically 2 (two) times daily. 07/05/17   Vena AustriaStaebler, Nysha Koplin, MD    Physical Exam Vitals:  Vitals:   07/05/17 1446  BP: 104/66  Pulse: 82   No LMP recorded. Patient has had an ablation.  General: NAD HEENT: normocephalic, anicteric Pulmonary: No increased work of breathing Abdomen: NABS, soft, non-tender, non-distended.  Umbilicus without lesions.  No hepatomegaly, splenomegaly or masses palpable. No evidence of  hernia The inferior aspect of the old midline vertical incision has two small crusted over area.  No discharge today.  The area was not unroofed.  Mild surrounding erythema no induration Neurologic: Grossly intact Psychiatric: mood appropriate, affect full  Assessment: 41 y.o. G3P3003 candida vs sinus tract  Plan: Problem List Items Addressed This Visit    None    Visit Diagnoses    Postoperative wound sinus, sequela    -  Primary      Trial of nystatin cream, if does not resolve discussed scar revision.  Less likely to be foreign body reaction to permenant suture given depth of subcuatneous tissue but remains in differential.  This may be an old stitch sinus tract that has epithelialized and intermittently breaks open. Discussed that revision would need to be in OR in case there is a tract all the way to the fascia,  A total of 15 minutes were spent in face-to-face contact with the patient during this encounter with over half of that time devoted to counseling and coordination of care.

## 2018-02-16 ENCOUNTER — Ambulatory Visit (INDEPENDENT_AMBULATORY_CARE_PROVIDER_SITE_OTHER): Payer: BLUE CROSS/BLUE SHIELD | Admitting: Obstetrics and Gynecology

## 2018-02-16 ENCOUNTER — Encounter: Payer: Self-pay | Admitting: Obstetrics and Gynecology

## 2018-02-16 ENCOUNTER — Other Ambulatory Visit (HOSPITAL_COMMUNITY)
Admission: RE | Admit: 2018-02-16 | Discharge: 2018-02-16 | Disposition: A | Discharge status: Home / Self Care | Payer: BLUE CROSS/BLUE SHIELD | Source: Ambulatory Visit | Attending: Obstetrics and Gynecology | Admitting: Obstetrics and Gynecology

## 2018-02-16 VITALS — BP 122/78 | Ht 65.0 in | Wt 202.0 lb

## 2018-02-16 DIAGNOSIS — N939 Abnormal uterine and vaginal bleeding, unspecified: Secondary | ICD-10-CM

## 2018-02-16 DIAGNOSIS — Z1151 Encounter for screening for human papillomavirus (HPV): Secondary | ICD-10-CM | POA: Diagnosis not present

## 2018-02-16 DIAGNOSIS — Z124 Encounter for screening for malignant neoplasm of cervix: Secondary | ICD-10-CM | POA: Insufficient documentation

## 2018-02-16 NOTE — Progress Notes (Signed)
Gynecology Abnormal Uterine Bleeding Initial Evaluation   Chief Complaint:  Chief Complaint  Patient presents with  . Metrorrhagia    Ablation    History of Present Illness:    Paitient is a 42 y.o. [id number] who LMP was No LMP recorded. Patient has had an ablation., presents today for a problem visit.  She complains of metrorrhagia (no pattern) that ebgan in the past year and its severity is described as moderate.  She has irregular periods from 2-4 weeks with significant dysmenorrhea.  In addition she has episodes of dysmenorrhea like symptoms without any associated vaginal bleeding..   The patient is sexually active. She currently uses prior salpingectomy for contraception.   Previous evaluation: ultrasound, medical management, hysteroscopy, D&C and endometrial ablation,    Review of Systems: Review of Systems  Constitutional: Negative for chills and fever.  HENT: Negative for congestion.   Respiratory: Negative for cough and shortness of breath.   Cardiovascular: Negative for chest pain and palpitations.  Gastrointestinal: Negative for abdominal pain, constipation, diarrhea, heartburn, nausea and vomiting.  Genitourinary: Negative for dysuria, frequency and urgency.  Skin: Negative for itching and rash.  Neurological: Negative for dizziness and headaches.  Endo/Heme/Allergies: Negative for polydipsia.  Psychiatric/Behavioral: Negative for depression.    Past Medical History:  Past Medical History:  Diagnosis Date  . Abnormal uterine bleeding   . Anxiety   . GERD (gastroesophageal reflux disease)   . Lower extremity edema   . PCOS (polycystic ovarian syndrome)     Past Surgical History:  Past Surgical History:  Procedure Laterality Date  . BILATERAL SALPINGECTOMY  04/2015   [facility]  . c sections    . COMBINED HYSTEROSCOPY DIAGNOSTIC / D&C  2016   [facility]  . ENDOMETRIAL ABLATION N/A 04/18/2015   Procedure: ENDOMETRIAL ABLATION;  Surgeon: Vena Austria, MD;   Location: [facility];  Service: Gynecology;  Laterality: N/A;  . LAPAROSCOPIC TUBAL LIGATION Bilateral 04/18/2015   Procedure: LAPAROSCOPIC TUBAL LIGATION;  Surgeon: Vena Austria, MD;  Location: [facility];  Service: Gynecology;  Laterality: Bilateral;    Obstetric History: [id number]  Family History:  Family History  Problem Relation Age of Onset  . Leukemia Maternal Grandfather     Social History:  Social History   Socioeconomic History  . Marital status: Divorced    Spouse name: Not on file  . Number of children: Not on file  . Years of education: Not on file  . Highest education level: Not on file  Occupational History  . Not on file  Social Needs  . Financial resource strain: Not on file  . Food insecurity:    Worry: Not on file    Inability: Not on file  . Transportation needs:    Medical: Not on file    Non-medical: Not on file  Tobacco Use  . Smoking status: Never Smoker  . Smokeless tobacco: Never Used  Substance and Sexual Activity  . Alcohol use: No  . Drug use: No  . Sexual activity: Yes    Partners: Male    Birth control/protection: None  Lifestyle  . Physical activity:    Days per week: Not on file    Minutes per session: Not on file  . Stress: Not on file  Relationships  . Social connections:    Talks on phone: Not on file    Gets together: Not on file    Attends religious service: Not on file    Active member of club or organization:  Not on file    Attends meetings of clubs or organizations: Not on file    Relationship status: Not on file  . Intimate partner violence:    Fear of current or ex partner: Not on file    Emotionally abused: Not on file    Physically abused: Not on file    Forced sexual activity: Not on file  Other Topics Concern  . Not on file  Social History Narrative  . Not on file    Allergies:  No Known Allergies  Medications: Prior to Admission medications   Medication Sig Start Date End Date Taking? Authorizing  Provider  furosemide (LASIX) 10 MG/ML solution Take 10 mg by mouth 2 (two) times daily.   Yes [provider]  ibuprofen (ADVIL,MOTRIN) 600 MG tablet Take 1 tablet (600 mg total) by mouth every 6 (six) hours as needed. 04/18/15  Yes Vena AustriaStaebler, Treylan Mcclintock, MD  nystatin cream (MYCOSTATIN) Apply 1 application topically 2 (two) times daily. 07/05/17  Yes Vena AustriaStaebler, Skylin Kennerson, MD    Physical Exam Blood pressure 122/78, height 5\' 5"  (1.651 m), weight 202 lb (91.6 kg).  No LMP recorded. Patient has had an ablation.  General: NAD HEENT: normocephalic, anicteric Thyroid: no enlargement, no palpable nodules Pulmonary: No increased work of breathing Cardiovascular: RRR, distal pulses 2+ Abdomen: NABS, soft, non-tender, non-distended.  Umbilicus without lesions.  No hepatomegaly, splenomegaly or masses palpable. No evidence of hernia  Genitourinary:  External: Normal external female genitalia.  Normal urethral meatus, normal Bartholin's and Skene's glands.    Vagina: Normal vaginal mucosa, no evidence of prolapse.    Cervix: Grossly normal in appearance, no bleeding  Uterus: Non-enlarged, mobile, normal contour.  No CMT  Adnexa: ovaries non-enlarged, no adnexal masses  Rectal: deferred  Lymphatic: no evidence of inguinal lymphadenopathy Extremities: no edema, erythema, or tenderness Neurologic: Grossly intact Psychiatric: mood appropriate, affect full  Female chaperone present for pelvic portions of the physical exam  Assessment: 42 y.o. [phone] with abnormal uterine bleeding  Plan: Problem List Items Addressed This Visit    None    Visit Diagnoses    Abnormal uterine bleeding    -  Primary   Relevant Orders   US PELVIS TRANSVANGINAL NON-OB (TV ONLY)   Screening for malignant neoplasm of cervix       Relevant Orders   Cytology - PAP      1) Abnormal uterine bleeding and dysmenorrhea - concern for failed ablation given constellation of symptoms with partially obstructed  menstruation.  We had previously discussed 1 in 4 failure rate of endometrial ablation at 4 years.  Options for management are limited including attempts at cycle suppression with progestins.  Mirena IUD or attempts at repeat ablation are generally not feasible given uterine synechia. The definitive treatment would be hysterectomy in this setting.  - evaluate uterine cavity with ultrasound - RIGHT SIDED UTERINE ADHESIVE DISEASE WAS NOTED AT THE TIME OF SALPINGECTOMY  2) Return in about 1 week (around 02/23/2018) for GYN US AUB and follow up.   Vena AustriaAndreas Yadier Bramhall, MD, Evern CoreFACOG [facility], [facility] 02/17/2018, 5:35 PM

## 2018-02-18 LAB — CYTOLOGY - PAP
DIAGNOSIS: NEGATIVE
HPV: NOT DETECTED

## 2018-02-21 ENCOUNTER — Encounter (INDEPENDENT_AMBULATORY_CARE_PROVIDER_SITE_OTHER): Payer: Self-pay

## 2018-02-28 ENCOUNTER — Ambulatory Visit (INDEPENDENT_AMBULATORY_CARE_PROVIDER_SITE_OTHER): Payer: BLUE CROSS/BLUE SHIELD

## 2018-02-28 ENCOUNTER — Encounter: Payer: Self-pay | Admitting: Obstetrics and Gynecology

## 2018-02-28 ENCOUNTER — Ambulatory Visit (INDEPENDENT_AMBULATORY_CARE_PROVIDER_SITE_OTHER): Payer: BLUE CROSS/BLUE SHIELD | Admitting: Obstetrics and Gynecology

## 2018-02-28 VITALS — BP 104/62 | HR 90 | Wt 207.0 lb

## 2018-02-28 DIAGNOSIS — N939 Abnormal uterine and vaginal bleeding, unspecified: Secondary | ICD-10-CM

## 2018-02-28 MED ORDER — OMEPRAZOLE 20 MG PO CPDR: 20.0000 mg | DELAYED_RELEASE_CAPSULE | Freq: Every day | ORAL | 11 refills | Status: DC

## 2018-02-28 NOTE — Patient Instructions (Signed)
[facility] [address] [place] [address]  [facility] [place]  [address] [place] [address]  Phone: [phone]  August 21st 2019

## 2018-03-01 ENCOUNTER — Other Ambulatory Visit: Payer: Self-pay | Admitting: Obstetrics and Gynecology

## 2018-03-01 DIAGNOSIS — Z1231 Encounter for screening mammogram for malignant neoplasm of breast: Secondary | ICD-10-CM

## 2018-03-01 NOTE — Progress Notes (Signed)
Gynecology Ultrasound Follow Up  Chief Complaint:  Chief Complaint  Patient presents with  . Follow-up    GYN U/S     History of Present Illness: Patient is a 42 y.o. female who presents today for ultrasound evaluation of AUB following prior endometrial ablation.  Ultrasound demonstrates the following findgins Adnexa: no adnexal masses Uterus: Non-enlarged with endometrial stripe  2mm but irregular myometrial endometrial interface possibly seocndary to adenomyosis Additional: No free fluid  Patient reports no further bleeding or cramping since last visit.  Review of Systems: Review of Systems  Constitutional: Negative.   Gastrointestinal: Negative.   Endo/Heme/Allergies: Does not bruise/bleed easily.    Past Medical History:  Past Medical History:  Diagnosis Date  . Abnormal uterine bleeding   . Anxiety   . GERD (gastroesophageal reflux disease)   . Lower extremity edema   . PCOS (polycystic ovarian syndrome)     Past Surgical History:  Past Surgical History:  Procedure Laterality Date  . BILATERAL SALPINGECTOMY  04/2015   [facility]  . c sections    . COMBINED HYSTEROSCOPY DIAGNOSTIC / D&C  2016   [facility]  . ENDOMETRIAL ABLATION N/A 04/18/2015   Procedure: ENDOMETRIAL ABLATION;  Surgeon: Vena Austria, MD;  Location: [facility];  Service: Gynecology;  Laterality: N/A;  . LAPAROSCOPIC TUBAL LIGATION Bilateral 04/18/2015   Procedure: LAPAROSCOPIC TUBAL LIGATION;  Surgeon: Vena Austria, MD;  Location: [facility];  Service: Gynecology;  Laterality: Bilateral;    Gynecologic History:  No LMP recorded. Patient has had an ablation.   Family History:  Family History  Problem Relation Age of Onset  . Leukemia Maternal Grandfather     Social History:  Social History   Socioeconomic History  . Marital status: Divorced    Spouse name: Not on file  . Number of children: Not on file  . Years of education: Not on file  . Highest education level: Not on file    Occupational History  . Not on file  Social Needs  . Financial resource strain: Not on file  . Food insecurity:    Worry: Not on file    Inability: Not on file  . Transportation needs:    Medical: Not on file    Non-medical: Not on file  Tobacco Use  . Smoking status: Never Smoker  . Smokeless tobacco: Never Used  Substance and Sexual Activity  . Alcohol use: No  . Drug use: No  . Sexual activity: Yes    Partners: Male    Birth control/protection: None  Lifestyle  . Physical activity:    Days per week: Not on file    Minutes per session: Not on file  . Stress: Not on file  Relationships  . Social connections:    Talks on phone: Not on file    Gets together: Not on file    Attends religious service: Not on file    Active member of club or organization: Not on file    Attends meetings of clubs or organizations: Not on file    Relationship status: Not on file  . Intimate partner violence:    Fear of current or ex partner: Not on file    Emotionally abused: Not on file    Physically abused: Not on file    Forced sexual activity: Not on file  Other Topics Concern  . Not on file  Social History Narrative  . Not on file    Allergies:  No Known Allergies  Medications: Prior  to Admission medications   Medication Sig Start Date End Date Taking? Authorizing Provider  furosemide (LASIX) 10 MG/ML solution Take 10 mg by mouth 2 (two) times daily.    [provider]  ibuprofen (ADVIL,MOTRIN) 600 MG tablet Take 1 tablet (600 mg total) by mouth every 6 (six) hours as needed. 04/18/15   Vena AustriaStaebler, Desire Fulp, MD  nystatin cream (MYCOSTATIN) Apply 1 application topically 2 (two) times daily. 07/05/17   Vena AustriaStaebler, Adithi Gammon, MD  omeprazole (PRILOSEC) 20 MG capsule Take 1 capsule (20 mg total) by mouth daily. 02/28/18   Vena AustriaStaebler, Mylene Bow, MD    Physical Exam Vitals: Blood pressure 104/62, pulse 90, weight 207 lb (93.9 kg).  General: NAD HEENT: normocephalic,  anicteric Pulmonary: No increased work of breathing Extremities: no edema, erythema, or tenderness Neurologic: Grossly intact, normal gait Psychiatric: mood appropriate, affect full  [place] Pelvis Transvanginal Non-ob (tv Only)  Result Date: 03/01/2018 ULTRASOUND REPORT Patient Name: Wanda [name] DOB: 05/04/1976 MRN: [id number] Location: [facility] Date of Service: 02/28/2018 Indications:AUB Findings: The uterus is anteverted and measures 9.41 x 6.46 x 5.94cm. Echo texture is heterogenous without evidence of focal masses. The Endometrium measures 2.09 mm. Right Ovary measures 3.74 x 2.25 x 2.57 cm. It is normal in appearance. Left Ovary is not identified Survey of the adnexa demonstrates no adnexal masses. There is no free fluid in the cul de sac. Impression: 1. Heterogeneous uterus Recommendations: 1.Clinical correlation with the patient's History and Physical Exam. Willette AlmaKristen Priestley, RDMS, RVT Images reviewed.  Normal GYN study, however the myometrial endometrial interface is indistinct consistent with possible adenomyosis. Vena AustriaAndreas Carmon Sahli, MD, Merlinda FrederickFACOG [facility], [facility]     Assessment: 42 y.o. [phone] No problem-specific Assessment & Plan notes found for this encounter.   Plan: Problem List Items Addressed This Visit    None    Visit Diagnoses    Abnormal uterine bleeding    -  Primary      1) AUB -patient reports improvement/cessation since last visit.  We discussed given prior endometrial ablation, 27% failure rate at 4 years.  We discussed expectant management, hormonal management, and hysterectomy as treatment option of endometrial failure.  At present the patient reports she is most interested in expectant management  2) Refill omeprazole written  3) A total of 15 minutes were spent in face-to-face contact with the patient during this encounter with over half of that time devoted to counseling and coordination of care.  4) Return in about 1 year (around  03/01/2019) for annual.    Vena AustriaAndreas Flora Parks, MD, Merlinda FrederickFACOG [facility], [facility]

## 2018-03-24 ENCOUNTER — Ambulatory Visit
Admission: RE | Admit: 2018-03-24 | Discharge: 2018-03-24 | Disposition: A | Discharge status: Home / Self Care | Payer: BLUE CROSS/BLUE SHIELD | Source: Ambulatory Visit | Attending: Obstetrics and Gynecology | Admitting: Obstetrics and Gynecology

## 2018-03-24 DIAGNOSIS — Z1231 Encounter for screening mammogram for malignant neoplasm of breast: Secondary | ICD-10-CM | POA: Insufficient documentation

## 2018-12-23 ENCOUNTER — Other Ambulatory Visit: Payer: Self-pay | Admitting: Obstetrics and Gynecology

## 2018-12-23 NOTE — Telephone Encounter (Signed)
Pt not seen since 03/2018. Please advise

## 2019-02-08 ENCOUNTER — Other Ambulatory Visit: Payer: Self-pay | Admitting: Family Medicine

## 2019-02-08 ENCOUNTER — Other Ambulatory Visit: Payer: Self-pay | Admitting: Obstetrics and Gynecology

## 2019-02-08 DIAGNOSIS — Z1231 Encounter for screening mammogram for malignant neoplasm of breast: Secondary | ICD-10-CM

## 2019-02-13 NOTE — Telephone Encounter (Signed)
Appointment 1-4 weeks

## 2019-02-14 NOTE — Telephone Encounter (Signed)
Patient is schedule 03/07/19 at 9:30 for annual and follow up. Patient is aware of location, date and time

## 2019-03-07 ENCOUNTER — Encounter: Payer: Self-pay | Admitting: Obstetrics and Gynecology

## 2019-03-07 ENCOUNTER — Other Ambulatory Visit: Payer: Self-pay

## 2019-03-07 ENCOUNTER — Other Ambulatory Visit (HOSPITAL_COMMUNITY)
Admission: RE | Admit: 2019-03-07 | Discharge: 2019-03-07 | Disposition: A | Discharge status: Home / Self Care | Payer: BC Managed Care – PPO | Source: Ambulatory Visit | Attending: Obstetrics and Gynecology | Admitting: Obstetrics and Gynecology

## 2019-03-07 ENCOUNTER — Ambulatory Visit (INDEPENDENT_AMBULATORY_CARE_PROVIDER_SITE_OTHER): Payer: BC Managed Care – PPO | Admitting: Obstetrics and Gynecology

## 2019-03-07 VITALS — BP 130/76 | HR 101 | Ht 65.0 in | Wt 222.0 lb

## 2019-03-07 DIAGNOSIS — Z124 Encounter for screening for malignant neoplasm of cervix: Secondary | ICD-10-CM | POA: Diagnosis present

## 2019-03-07 DIAGNOSIS — Z01419 Encounter for gynecological examination (general) (routine) without abnormal findings: Secondary | ICD-10-CM

## 2019-03-07 DIAGNOSIS — Z634 Disappearance and death of family member: Secondary | ICD-10-CM

## 2019-03-07 DIAGNOSIS — Z1239 Encounter for other screening for malignant neoplasm of breast: Secondary | ICD-10-CM

## 2019-03-07 DIAGNOSIS — Z6836 Body mass index (BMI) 36.0-36.9, adult: Secondary | ICD-10-CM

## 2019-03-07 DIAGNOSIS — E669 Obesity, unspecified: Secondary | ICD-10-CM

## 2019-03-07 MED ORDER — BUPROPION HCL ER (XL) 150 MG PO TB24: 150.0000 mg | ORAL_TABLET | Freq: Every day | ORAL | 3 refills | Status: DC

## 2019-03-07 NOTE — Patient Instructions (Signed)
[facility] [address] [place] [address]  [facility] [place]  [address] [place] [address]  Phone: [phone]  

## 2019-03-07 NOTE — Progress Notes (Signed)
Gynecology Annual Exam  PCP: Colvin Caroli, MD  Chief Complaint:  Chief Complaint  Patient presents with  . Gynecologic Exam    Spotting few days ago, pain    History of Present Illness: Patient is a 43 y.o. Wanda [name] presents for annual exam. The patient has no complaints today.   LMP: No LMP recorded. Patient has had an ablation. Patient continues to have some intermittent spotting in setting of prior endometrial ablation.  Work up negative as far as [place] evaluation showing 50mm stripe last year.  Endometrial biopsy precluded by prior endometrial ablation.  Still not frequent or significant enough as far as dysmenorreha that patient is interested in trial of progestins or hysterectomy at present which were discussed last year.  The patient does perform self breast exams.  There is no notable family history of breast or ovarian cancer in her family.  The patient wears seatbelts: yes.   The patient has regular exercise: not asked.    The patient reports current symptoms of depression.  Her daughter died last 06/05/2023 following a car wreck.  Daughters husband drove car into a tree intentionally.  He survived.  She currently has custody of her daughters children.  She has been taking some prn xanax for symptoms.  Review of Systems: Review of Systems  Constitutional: Negative for chills and fever.  HENT: Negative for congestion.   Respiratory: Negative for cough and shortness of breath.   Cardiovascular: Negative for chest pain and palpitations.  Gastrointestinal: Negative for abdominal pain, constipation, diarrhea, heartburn, nausea and vomiting.  Genitourinary: Negative for dysuria, frequency and urgency.  Skin: Negative for itching and rash.  Neurological: Negative for dizziness and headaches.  Endo/Heme/Allergies: Negative for polydipsia.  Psychiatric/Behavioral: Positive for depression. Negative for hallucinations, memory loss, substance abuse and suicidal ideas. The patient is  nervous/anxious and has insomnia.     Past Medical History:  Past Medical History:  Diagnosis Date  . Abnormal uterine bleeding   . Anxiety   . GERD (gastroesophageal reflux disease)   . Lower extremity edema   . PCOS (polycystic ovarian syndrome)     Past Surgical History:  Past Surgical History:  Procedure Laterality Date  . BILATERAL SALPINGECTOMY  04/2015   [facility]  . c sections    . COMBINED HYSTEROSCOPY DIAGNOSTIC / D&C  2016   [facility]  . ENDOMETRIAL ABLATION N/A 04/18/2015   Procedure: ENDOMETRIAL ABLATION;  Surgeon: Malachy Mood, MD;  Location: [facility] ORS;  Service: Gynecology;  Laterality: N/A;  . LAPAROSCOPIC TUBAL LIGATION Bilateral 04/18/2015   Procedure: LAPAROSCOPIC TUBAL LIGATION;  Surgeon: Malachy Mood, MD;  Location: [facility] ORS;  Service: Gynecology;  Laterality: Bilateral;    Gynecologic History:  No LMP recorded. Patient has had an ablation. Contraception: tubal ligation Last Pap: Results were: 02/17/2019 NIL and HR HPV negative  Last mammogram: 8/Wanda/2019 Results were: BI-RAD I  Obstetric History: [id number]  Family History:  Family History  Problem Relation Age of Onset  . Leukemia Maternal Grandfather   . Breast cancer Sister        early 65's    Social History:  Social History   Socioeconomic History  . Marital status: Divorced    Spouse name: Not on file  . Number of children: Not on file  . Years of education: Not on file  . Highest education level: Not on file  Occupational History  . Not on file  Social Needs  . Financial resource strain: Not on file  . Food insecurity  Worry: Not on file    Inability: Not on file  . Transportation needs    Medical: Not on file    Non-medical: Not on file  Tobacco Use  . Smoking status: Never Smoker  . Smokeless tobacco: Never Used  Substance and Sexual Activity  . Alcohol use: No  . Drug use: No  . Sexual activity: Yes    Partners: Male    Birth control/protection: None  Lifestyle  .  Physical activity    Days per week: Not on file    Minutes per session: Not on file  . Stress: Not on file  Relationships  . Social Musicianconnections    Talks on phone: Not on file    Gets together: Not on file    Attends religious service: Not on file    Active member of club or organization: Not on file    Attends meetings of clubs or organizations: Not on file    Relationship status: Not on file  . Intimate partner violence    Fear of current or ex partner: Not on file    Emotionally abused: Not on file    Physically abused: Not on file    Forced sexual activity: Not on file  Other Topics Concern  . Not on file  Social History Narrative  . Not on file    Allergies:  No Known Allergies  Medications: Prior to Admission medications   Medication Sig Start Date End Date Taking? Authorizing Provider  furosemide (LASIX) 10 MG/ML solution Take 10 mg by mouth 2 (two) times daily.   Yes [provider]  ibuprofen (ADVIL,MOTRIN) 600 MG tablet Take 1 tablet (600 mg total) by mouth every 6 (six) hours as needed. 04/18/15  Yes Vena AustriaStaebler, Elvin Banker, MD  nystatin cream (MYCOSTATIN) Apply 1 application topically 2 (two) times daily. 07/05/17  Yes Vena AustriaStaebler, Bernetta Sutley, MD  omeprazole (PRILOSEC) 20 MG capsule TAKE 1 CAPSULE BY MOUTH EVERY DAY 5/Wanda/20  Yes Vena AustriaStaebler, Tarah Buboltz, MD    Physical Exam Vitals: Blood pressure 130/76, pulse (!) 101, height 5\' 5"  (1.651 m), weight 222 lb (100.7 kg). Body mass index is 36.94 kg/m.   General: NAD HEENT: normocephalic, anicteric Thyroid: no enlargement, no palpable nodules Pulmonary: No increased work of breathing, CTAB Cardiovascular: RRR, distal pulses 2+ Breast: Breast symmetrical, no tenderness, no palpable nodules or masses, no skin or nipple retraction present, no nipple discharge.  No axillary or supraclavicular lymphadenopathy. Abdomen: NABS, soft, non-tender, non-distended.  Umbilicus without lesions.  No hepatomegaly, splenomegaly or masses  palpable. No evidence of hernia  Genitourinary:  External: Normal external female genitalia.  Normal urethral meatus, normal Bartholin's and Skene's glands.    Vagina: Normal vaginal mucosa, no evidence of prolapse.    Cervix: Grossly normal in appearance, no bleeding  Uterus: Non-enlarged, mobile, normal contour.  No CMT  Adnexa: ovaries non-enlarged, no adnexal masses  Rectal: deferred  Lymphatic: no evidence of inguinal lymphadenopathy Extremities: no edema, erythema, or tenderness Neurologic: Grossly intact Psychiatric: mood appropriate, affect full  Female chaperone present for pelvic and breast  portions of the physical exam    Assessment: 43 y.o. [id number] routine annual exam  Plan: Problem List Items Addressed This Visit    None    Visit Diagnoses    Encounter for gynecological examination without abnormal finding    -  Primary   Breast screening       Relevant Orders   MM 3D SCREEN BREAST BILATERAL   Bereavement  Class 2 obesity without serious comorbidity with body mass index (BMI) of 36.0 to 36.9 in adult, unspecified obesity type       Screening for malignant neoplasm of cervix       Relevant Orders   Cytology - PAP      1) Mammogram - recommend yearly screening mammogram.  Mammogram Was ordered today   2) STI screening  was notoffered and therefore not obtained  3) ASCCP guidelines and rational discussed.  Patient opts for yearly screening interval  4) Contraception - the patient is currently using  tubal ligation.  She is happy with her current form of contraception and plans to continue  5) Colonoscopy -- at age 43  6) Routine healthcare maintenance including cholesterol, diabetes screening discussed managed by PCP  7) Bereavement - start Wellbutrin 150mg  daily.  The patient reports that she is unhappy with her current weight.  Discussed that Wellbutrin is associated with weight loss.  Unlike phentermine and other stimulant weight loss medication it  is likely to ameliorate as opposed to worsen her moods.  If worsening of symptoms or side-effects to contact for change in therapy  8) Return in about 4 weeks (around 04/04/2019) for  medication follow up (phone or in office).   Vena AustriaAndreas Ajani Schnieders, MD, Evern CoreFACOG [facility], [facility] 03/07/2019, 10:02 AM

## 2019-03-09 LAB — CYTOLOGY - PAP
Diagnosis: NEGATIVE
HPV: NOT DETECTED

## 2019-03-27 ENCOUNTER — Ambulatory Visit
Admission: RE | Admit: 2019-03-27 | Discharge: 2019-03-27 | Disposition: A | Discharge status: Home / Self Care | Payer: BC Managed Care – PPO | Source: Ambulatory Visit | Attending: Obstetrics and Gynecology | Admitting: Obstetrics and Gynecology

## 2019-03-27 DIAGNOSIS — Z1231 Encounter for screening mammogram for malignant neoplasm of breast: Secondary | ICD-10-CM | POA: Diagnosis present

## 2019-03-29 ENCOUNTER — Other Ambulatory Visit: Payer: Self-pay | Admitting: Obstetrics and Gynecology

## 2019-12-26 ENCOUNTER — Other Ambulatory Visit: Payer: Self-pay | Admitting: Obstetrics and Gynecology

## 2020-01-29 ENCOUNTER — Other Ambulatory Visit: Payer: Self-pay | Admitting: Obstetrics and Gynecology

## 2020-02-28 IMAGING — MG DIGITAL SCREENING BILATERAL MAMMOGRAM WITH TOMO AND CAD
6 of 12 series · 6 of 36 positions shown · non-contrast
Comparison: Previous exam(s).

CLINICAL DATA: Screening.

EXAM:
DIGITAL SCREENING BILATERAL MAMMOGRAM WITH TOMO AND CAD

[R CC synth-2D]
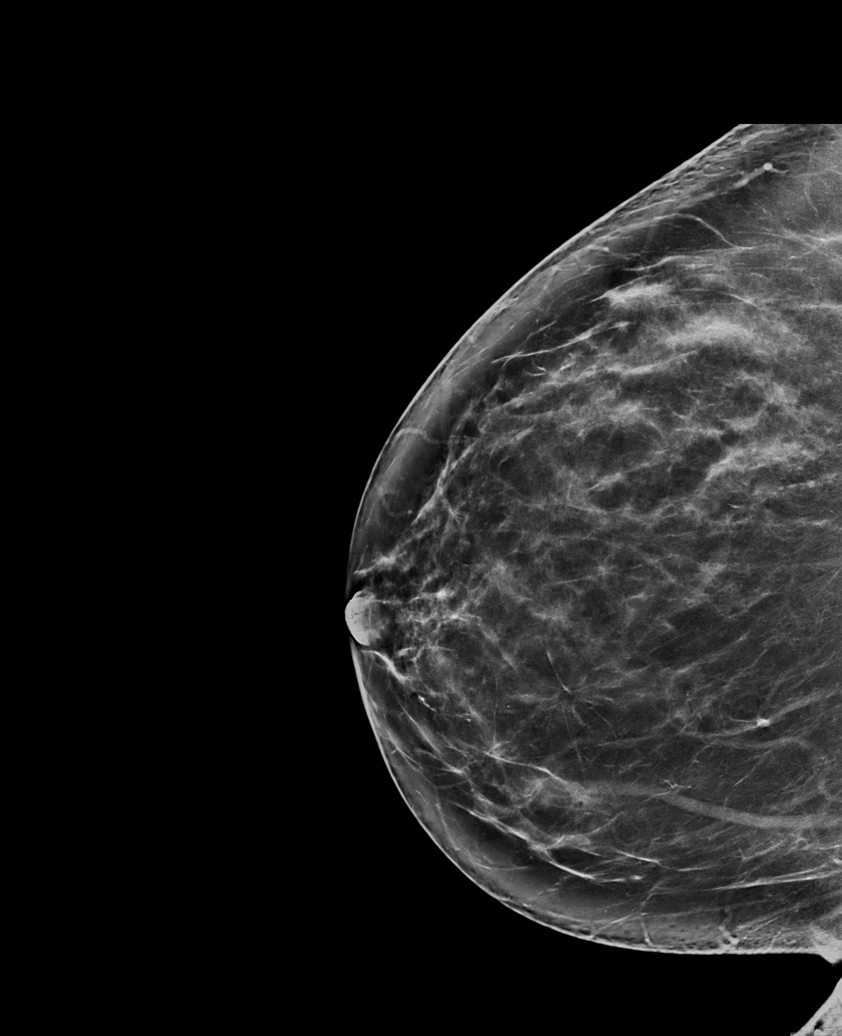

[R MLO synth-2D (1 of 2)]
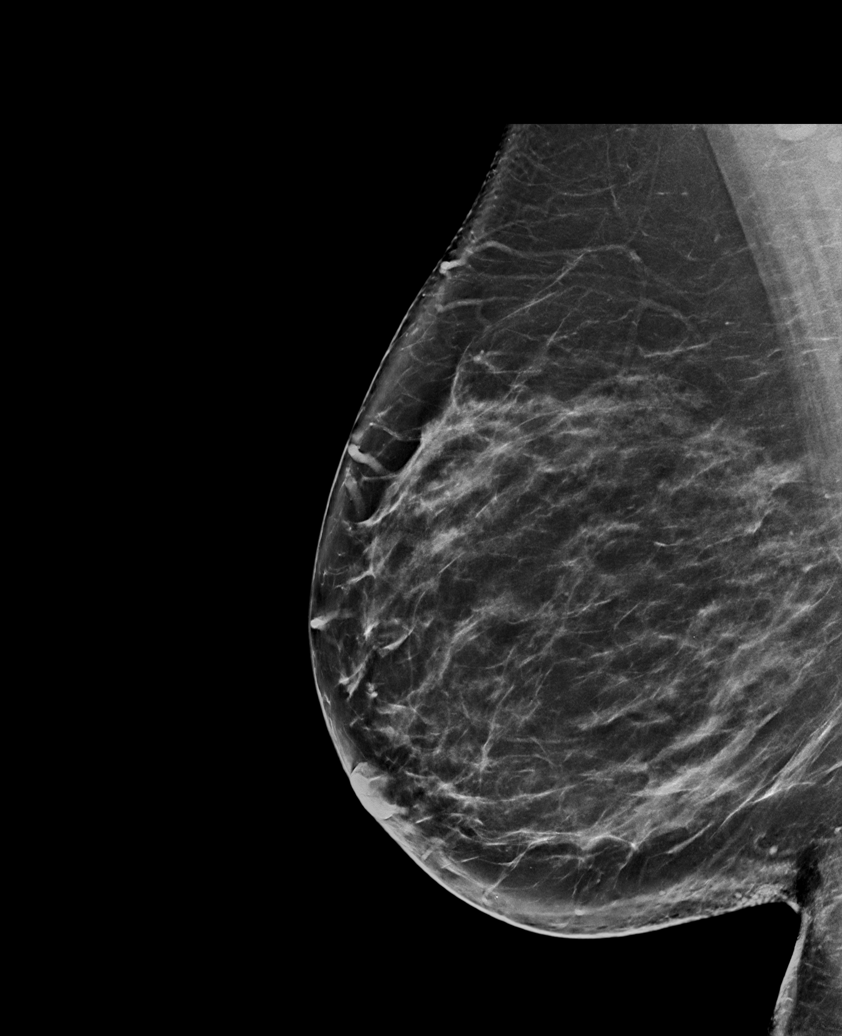

[L MLO synth-2D]
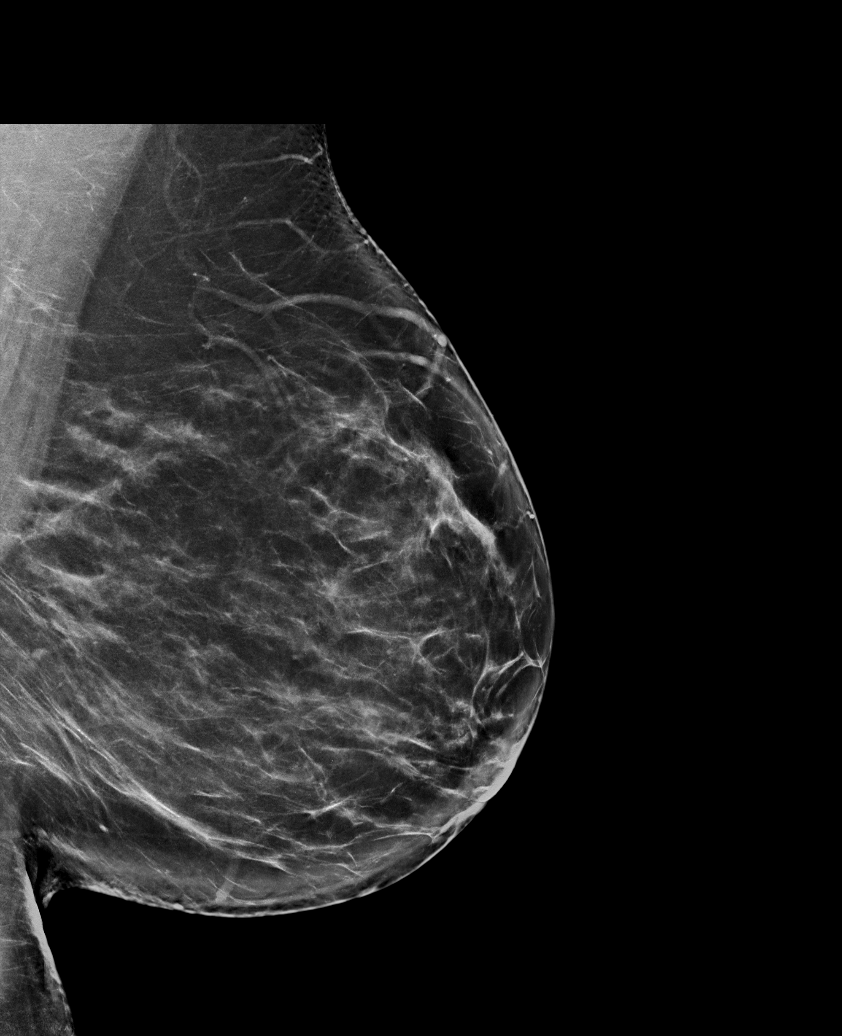

[L CC synth-2D (1 of 2)]
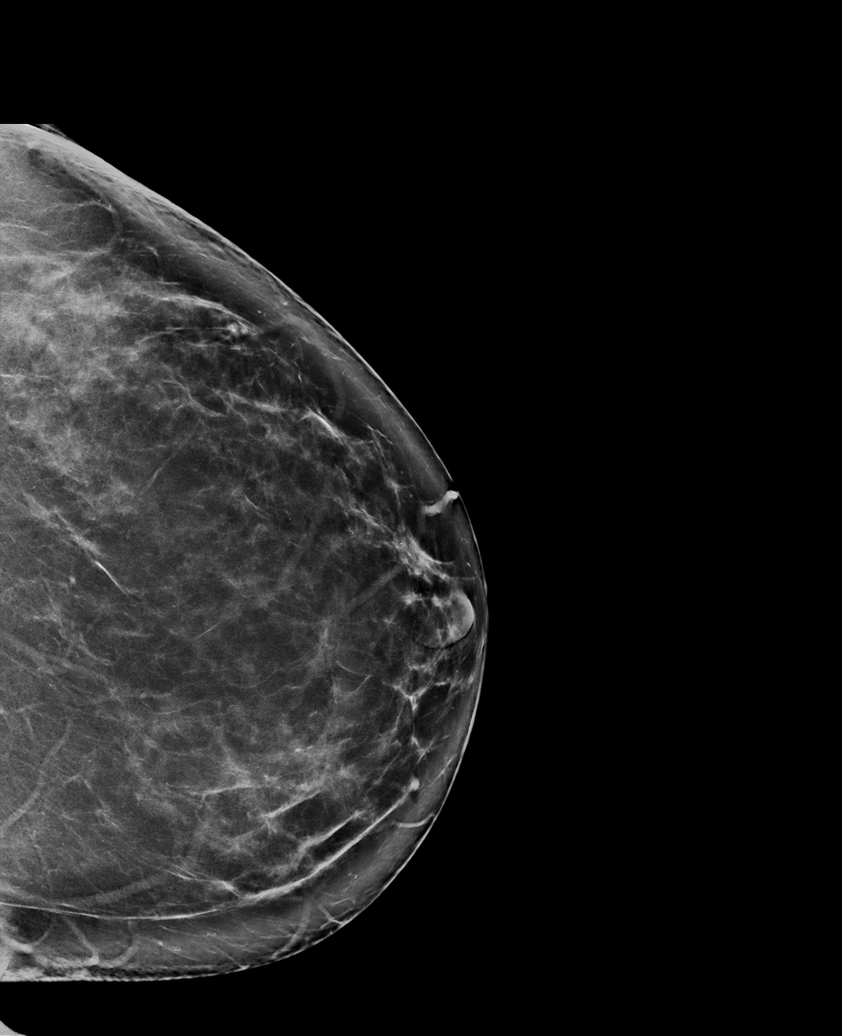

[L CC synth-2D (2 of 2)]
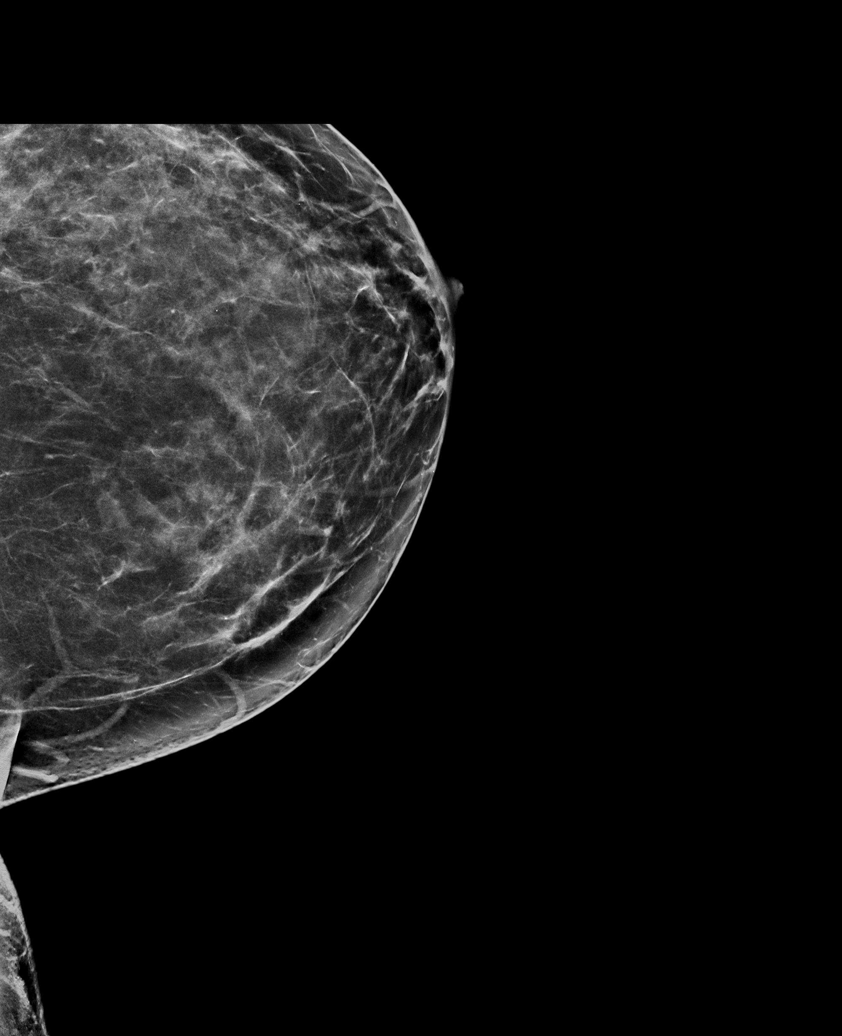

[R MLO synth-2D (2 of 2)]
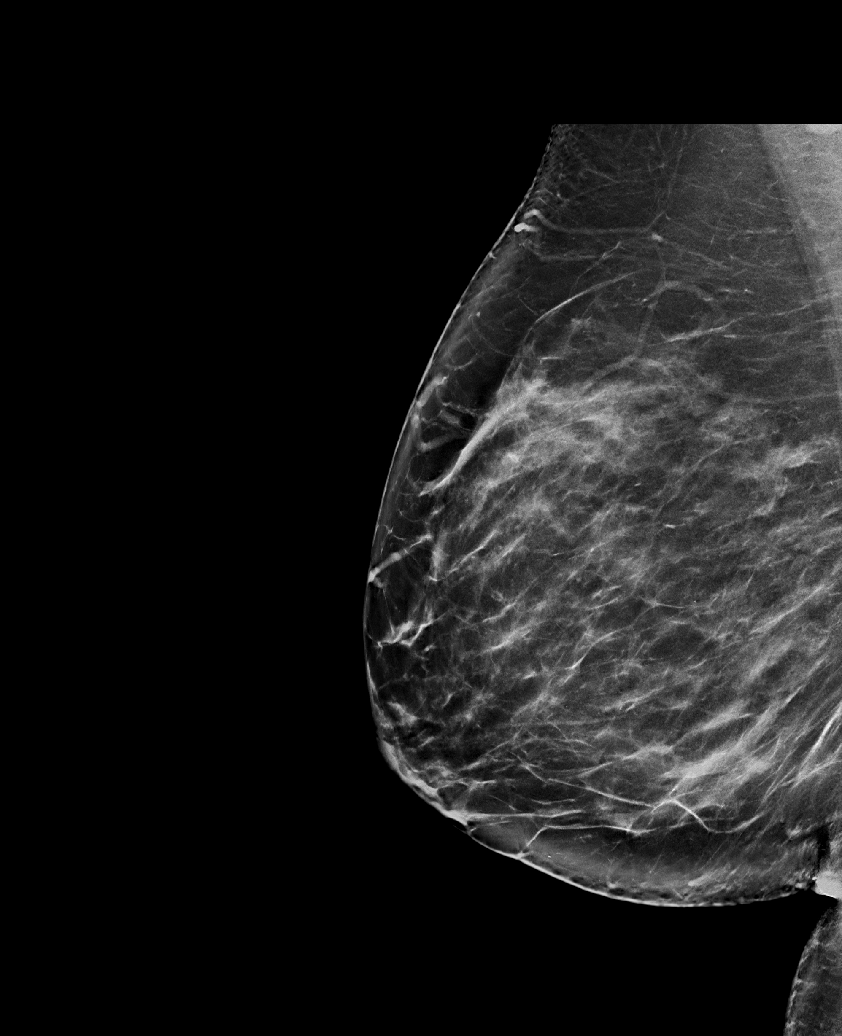

[6 of 36 positions shown; findings below may reference images not displayed]

ACR Breast Density Category b: There are scattered areas of
fibroglandular density.
FINDINGS: There are no findings suspicious for malignancy. Images were
processed with CAD.
IMPRESSION: No mammographic evidence of malignancy. A result letter of this
screening mammogram will be mailed directly to the patient.

RECOMMENDATION:
Screening mammogram in one year. (Code:[id number])

BI-RADS CATEGORY  1: Negative.

## 2023-01-18 DIAGNOSIS — Z5320 Procedure and treatment not carried out because of patient's decision for unspecified reasons: Secondary | ICD-10-CM | POA: Diagnosis not present

## 2023-01-18 DIAGNOSIS — G629 Polyneuropathy, unspecified: Secondary | ICD-10-CM | POA: Diagnosis not present

## 2023-01-18 DIAGNOSIS — M545 Low back pain, unspecified: Secondary | ICD-10-CM | POA: Diagnosis not present

## 2023-01-18 DIAGNOSIS — G8929 Other chronic pain: Secondary | ICD-10-CM | POA: Diagnosis not present

## 2023-01-18 DIAGNOSIS — R609 Edema, unspecified: Secondary | ICD-10-CM | POA: Diagnosis not present

## 2023-01-18 DIAGNOSIS — E669 Obesity, unspecified: Secondary | ICD-10-CM | POA: Diagnosis not present

## 2023-01-18 DIAGNOSIS — E782 Mixed hyperlipidemia: Secondary | ICD-10-CM | POA: Diagnosis not present

## 2023-01-18 DIAGNOSIS — F339 Major depressive disorder, recurrent, unspecified: Secondary | ICD-10-CM | POA: Diagnosis not present

## 2023-01-18 DIAGNOSIS — R7301 Impaired fasting glucose: Secondary | ICD-10-CM | POA: Diagnosis not present

## 2023-01-18 DIAGNOSIS — R5383 Other fatigue: Secondary | ICD-10-CM | POA: Diagnosis not present

## 2023-01-18 DIAGNOSIS — Z79899 Other long term (current) drug therapy: Secondary | ICD-10-CM | POA: Diagnosis not present

## 2023-01-18 DIAGNOSIS — F411 Generalized anxiety disorder: Secondary | ICD-10-CM | POA: Diagnosis not present

## 2023-01-18 DIAGNOSIS — K219 Gastro-esophageal reflux disease without esophagitis: Secondary | ICD-10-CM | POA: Diagnosis not present

## 2023-01-18 DIAGNOSIS — R5381 Other malaise: Secondary | ICD-10-CM | POA: Diagnosis not present

## 2023-03-26 DIAGNOSIS — M5441 Lumbago with sciatica, right side: Secondary | ICD-10-CM | POA: Diagnosis not present

## 2023-03-26 DIAGNOSIS — R200 Anesthesia of skin: Secondary | ICD-10-CM | POA: Diagnosis not present

## 2023-03-26 DIAGNOSIS — M5442 Lumbago with sciatica, left side: Secondary | ICD-10-CM | POA: Diagnosis not present

## 2023-03-26 DIAGNOSIS — G8929 Other chronic pain: Secondary | ICD-10-CM | POA: Diagnosis not present

## 2023-03-29 DIAGNOSIS — M5441 Lumbago with sciatica, right side: Secondary | ICD-10-CM | POA: Diagnosis not present

## 2023-03-29 DIAGNOSIS — G8929 Other chronic pain: Secondary | ICD-10-CM | POA: Diagnosis not present

## 2023-03-29 DIAGNOSIS — M439 Deforming dorsopathy, unspecified: Secondary | ICD-10-CM | POA: Diagnosis not present

## 2023-03-29 DIAGNOSIS — M5442 Lumbago with sciatica, left side: Secondary | ICD-10-CM | POA: Diagnosis not present

## 2023-05-17 DIAGNOSIS — H6092 Unspecified otitis externa, left ear: Secondary | ICD-10-CM | POA: Diagnosis not present

## 2023-06-28 DIAGNOSIS — M5441 Lumbago with sciatica, right side: Secondary | ICD-10-CM | POA: Diagnosis not present

## 2023-06-28 DIAGNOSIS — M5442 Lumbago with sciatica, left side: Secondary | ICD-10-CM | POA: Diagnosis not present

## 2023-06-28 DIAGNOSIS — G8929 Other chronic pain: Secondary | ICD-10-CM | POA: Diagnosis not present

## 2023-08-02 DIAGNOSIS — J329 Chronic sinusitis, unspecified: Secondary | ICD-10-CM | POA: Diagnosis not present
